# Patient Record
Sex: Female | Born: 1960 | Race: White | Hispanic: No | Marital: Married | Smoking: Never smoker
Health system: Southern US, Community
[De-identification: ages and names within clinical notes are randomized; demographics above are authoritative.]

---

## 2014-01-30 ENCOUNTER — Emergency Department: Payer: BC Managed Care – PPO

## 2014-01-30 ENCOUNTER — Emergency Department
Admission: EM | Admit: 2014-01-30 | Discharge: 2014-01-30 | Disposition: A | Discharge status: Home / Self Care | Payer: BC Managed Care – PPO | Attending: Emergency Medicine | Admitting: Emergency Medicine

## 2014-01-30 DIAGNOSIS — IMO0001 Reserved for inherently not codable concepts without codable children: Secondary | ICD-10-CM

## 2014-01-30 DIAGNOSIS — N390 Urinary tract infection, site not specified: Secondary | ICD-10-CM | POA: Insufficient documentation

## 2014-01-30 DIAGNOSIS — N200 Calculus of kidney: Secondary | ICD-10-CM | POA: Insufficient documentation

## 2014-01-30 DIAGNOSIS — R1031 Right lower quadrant pain: Secondary | ICD-10-CM | POA: Insufficient documentation

## 2014-01-30 DIAGNOSIS — R109 Unspecified abdominal pain: Secondary | ICD-10-CM

## 2014-01-30 DIAGNOSIS — I10 Essential (primary) hypertension: Secondary | ICD-10-CM | POA: Insufficient documentation

## 2014-01-30 LAB — URINALYSIS WITH CULTURE REFLEX
Bilirubin, UA: NEGATIVE
Blood, UA: NEGATIVE
Glucose, UA: NEGATIVE mg/dL
Ketones UA: NEGATIVE mg/dL
Leukocyte Esterase, UA: 75 Leu/uL — AB
Nitrite, UA: NEGATIVE
Protein, UR: 30 mg/dL — AB
RBC, UA: 13 /hpf — ABNORMAL HIGH (ref 0–5)
Urine Specific Gravity: 1.034 (ref 1.001–1.040)
Urobilinogen, UA: NORMAL mg/dL
WBC, UA: 12 /hpf — ABNORMAL HIGH (ref 0–4)
pH, Urine: 5 pH (ref 5.0–8.0)

## 2014-01-30 LAB — HEPATIC FUNCTION PANEL
ALT: 19 U/L (ref 0–55)
AST (SGOT): 24 U/L (ref 10–42)
Albumin/Globulin Ratio: 1.22 Ratio (ref 0.70–1.50)
Albumin: 3.9 gm/dL (ref 3.5–5.0)
Alkaline Phosphatase: 105 U/L (ref 40–145)
Bilirubin Direct: <0.1 mg/dL (ref 0.0–0.3)
Bilirubin, Total: 0.2 mg/dL (ref 0.1–1.2)
Globulin: 3.2 gm/dL (ref 2.0–4.0)
Protein, Total: 7.1 gm/dL (ref 6.0–8.3)

## 2014-01-30 LAB — CBC AND DIFFERENTIAL
Basophils %: 0.2 % (ref 0.0–3.0)
Basophils Absolute: 0 10*3/uL (ref 0.0–0.3)
Eosinophils %: 0.7 % (ref 0.0–7.0)
Eosinophils Absolute: 0.1 10*3/uL (ref 0.0–0.8)
Hematocrit: 40.6 % (ref 36.0–48.0)
Hemoglobin: 13.4 gm/dL (ref 12.0–16.0)
Lymphocytes Absolute: 2 10*3/uL (ref 0.6–5.1)
Lymphocytes: 17.4 % (ref 15.0–46.0)
MCH: 28 pg (ref 28–35)
MCHC: 33 gm/dL (ref 32–36)
MCV: 86 fL (ref 80–100)
MPV: 7.5 fL (ref 6.0–10.0)
Monocytes Absolute: 0.3 10*3/uL (ref 0.1–1.7)
Monocytes: 2.9 % — ABNORMAL LOW (ref 3.0–15.0)
Neutrophils %: 78.8 % — ABNORMAL HIGH (ref 42.0–78.0)
Neutrophils Absolute: 8.9 10*3/uL — ABNORMAL HIGH (ref 1.7–8.6)
PLT CT: 345 10*3/uL (ref 130–440)
RBC: 4.74 10*6/uL (ref 3.80–5.00)
RDW: 12.8 % (ref 11.0–14.0)
WBC: 11.3 10*3/uL — ABNORMAL HIGH (ref 4.0–11.0)

## 2014-01-30 LAB — I-STAT CHEM 8 CARTRIDGE
Anion Gap I-Stat: 17 — ABNORMAL HIGH (ref 7–16)
BUN I-Stat: 13 mg/dL (ref 6–20)
Calcium Ionized I-Stat: 4 mg/dL — ABNORMAL LOW (ref 4.35–5.10)
Chloride I-Stat: 109 mMol/L (ref 98–112)
Creatinine I-Stat: 0.8 mg/dL (ref 0.60–1.10)
EGFR: >60 mL/min/{1.73_m2}
Glucose I-Stat: 134 mg/dL — ABNORMAL HIGH (ref 70–99)
Hematocrit I-Stat: 38 % (ref 36.0–48.0)
Hemoglobin I-Stat: 12.9 gm/dL (ref 12.0–16.0)
Potassium I-Stat: 3.9 mMol/L (ref 3.5–5.3)
Sodium I-Stat: 140 mMol/L (ref 135–145)
TCO2 I-Stat: 19 mMol/L — ABNORMAL LOW (ref 24–29)

## 2014-01-30 LAB — LIPASE: Lipase: 16 U/L (ref 8–78)

## 2014-01-30 LAB — VH I-STAT CHEM 8 NOTIFICATION

## 2014-01-30 MED ORDER — ONDANSETRON HCL 4 MG/2ML IJ SOLN
INTRAMUSCULAR | Status: AC
Start: 2014-01-30 — End: ?
  Filled 2014-01-30: qty 2

## 2014-01-30 MED ORDER — HYDROCODONE-ACETAMINOPHEN 5-325 MG PO TABS
1.00 | ORAL_TABLET | Freq: Four times a day (QID) | ORAL | Status: DC | PRN
Start: 2014-01-30 — End: 2017-05-23

## 2014-01-30 MED ORDER — ONDANSETRON HCL 4 MG/2ML IJ SOLN
4.00 mg | Freq: Once | INTRAMUSCULAR | Status: AC
Start: 2014-01-30 — End: 2014-01-30
  Administered 2014-01-30: 4 mg via INTRAVENOUS

## 2014-01-30 MED ORDER — VH HYDROMORPHONE HCL PF 1 MG/ML CARPUJECT
0.50 mg | Freq: Once | INTRAMUSCULAR | Status: AC
Start: 2014-01-30 — End: 2014-01-30
  Administered 2014-01-30: 0.5 mg via INTRAVENOUS

## 2014-01-30 MED ORDER — CEPHALEXIN 500 MG PO CAPS
500.00 mg | ORAL_CAPSULE | Freq: Four times a day (QID) | ORAL | Status: AC
Start: 2014-01-30 — End: 2014-02-09

## 2014-01-30 MED ORDER — ONDANSETRON 4 MG PO TBDP
4.0000 mg | ORAL_TABLET | Freq: Three times a day (TID) | ORAL | Status: AC | PRN
Start: 2014-01-30 — End: ?

## 2014-01-30 MED ORDER — CEFTRIAXONE SODIUM 1 G IJ SOLR
1.00 g | Freq: Once | INTRAMUSCULAR | Status: AC
Start: 2014-01-30 — End: 2014-01-30
  Administered 2014-01-30: 1 g via INTRAVENOUS

## 2014-01-30 MED ORDER — CEFTRIAXONE SODIUM 1 G IJ SOLR
INTRAMUSCULAR | Status: AC
Start: 2014-01-30 — End: ?
  Filled 2014-01-30: qty 1000

## 2014-01-30 MED ORDER — IOHEXOL 350 MG/ML IV SOLN
100.00 mL | Freq: Once | INTRAVENOUS | Status: AC | PRN
Start: 2014-01-30 — End: 2014-01-30
  Administered 2014-01-30: 100 mL via INTRAVENOUS

## 2014-01-30 MED ORDER — SODIUM CHLORIDE 0.9 % IV SOLN
INTRAVENOUS | Status: DC
Start: 2014-01-30 — End: 2014-01-30

## 2014-01-30 MED ORDER — VH HYDROMORPHONE HCL 1 MG/ML (NARRATOR)
INTRAMUSCULAR | Status: AC
Start: 2014-01-30 — End: ?
  Filled 2014-01-30: qty 1

## 2014-01-30 NOTE — ED Notes (Signed)
Bed: N6-A  Expected date:   Expected time:   Means of arrival:   Comments:  [facility]

## 2014-01-30 NOTE — Discharge Instructions (Signed)
Understanding Urinary Tract Infections (UTIs)  Most UTIs are caused by bacteria, although they may also be caused by viruses or fungi. Bacteria from the bowel are the most common source of infection.The infection may begin because of any of the following:   Sexual activity. During sex, germs can travel from the penis, vagina, or rectum into the urethra.   Germs on the skinoutside the rectum may travel into the urethra. This is more common in women since the rectum and urethra are closer to each other than in men. Wiping from front to back after using the toilet and keeping the area clean can help prevent germs from getting to the urethra.   Blockage of urine flow through the urinary tract. If urine sits too long, germs may begin to grow out of control.    Parts of the urinary tract  The infection canoccur in any part of the urinary tract.   The kidneys collect and store urine.   The ureters carry urine from the kidneys to the bladder.   The bladder holds urine until you are ready to let it out.   The urethra carries urine from the bladder out of the body. It is shorter in women, so bacteria can move throughit more easily.The urethra is longer in men, so a UTI is less likely to reach the bladder or kidneys in men.   [address] [employer] [address] [place], [place] [address]. All rights reserved. This information is not intended as a substitute for professional medical care. Always follow your healthcare professional's instructions.          Understanding Kidney Stones  Your kidneys are bean-shaped organs. They help filter extra salts, waste, and water from your body. You need to drink enough water every day to help flush the extra salts into your urine.    What are kidney stones?  Kidney stones are made up of chemical crystals that separate out from urine. These crystals clump together to make"stones."They form in the calyx of the kidney. They may stay in the kidney or move into  the urinary tract.  Why kidney stones form  Kidneys form stones for many reasons. If you don't drink enough water, for instance, you won't have enough urine to dilute chemicals. Then the chemicals may form crystals, which can develop into stones:   Fluid loss (dehydration) can concentrate urine, causing stones to form.   Certain foods contain large amounts of the chemicals that sometimes crystallize into stones. Eating foods that contain a lot of meat or salt can lead to more kidney stones.   Kidney infections foster stones by slowing urine flow or changing the acid balance of your urine.   Family history. If family membershave had kidney stones, you're more likely to have them, too.   Deficiencies of certain substances in the urine that help protect you from forming stones can also increase the formation of stones.  Where stones form  Stones begin in the cup-shaped part of the kidney (calyx). Some stay in the calyx and grow. Others moveinto the kidney pelvis or into the ureter. There they can lodge, block the flow of urine, and cause pain.  Symptoms  Many stones cause sudden and severe pain andbloody urine. Others cause nausea or frequent, burning urination. Symptoms often depend on your stone's size and location. Fever may indicate a serious infection. Call your doctor right away if you develop a fever.   2000-2015 [employer] [place]. [address]  [address], [place], [place] [address]. All rights reserved. This information is not intended as a substitute for professional medical care. Always follow your healthcare professional's instructions.      Strain all urine.  Place collected stone in specimen container for evaluation by Urologist.  Drink > 2 Liters of non-alcoholic liquid every 24 hours.  Take Acetaminophen 650 mg every 4-6 hours, not to exceed 3000 mg in 24 hours for minor pain and do not mix Acetaminophen with prescribed pain medication in that they both contain Acetaminophen.  Your doctor may  recommend Ibuprofen 400 mg every 6 hours additionally, however do not take without their instruction.  If you take the prescribed narcotic containing pain medication, take Senokot 2 tabs daily to prevent constipation.  Titrate Senokot by one tablet nightly until on soft bowel movement daily is achieved.  Return to ED or follow up with Urology doctor's office for pain or vomiting uncontrolled with medication.  Return to Emergency Department  for fever.    Take your blood pressure three times daily and record in a diary. Take results to your primary care physician for medication adjustment.

## 2014-01-30 NOTE — ED Notes (Signed)
Felt fine yesterday. Patient woke up at 0500 this morning. Abdominal, mid lower pain. Associated with some nausea and vomiting, but states the pain is what really bothers her.   Denies diarrhea.   Constant sharp pain.

## 2014-01-30 NOTE — ED Provider Notes (Signed)
Physician/Midlevel provider first contact with patient: 01/30/14 1610         History     Chief Complaint   Patient presents with   . Abdominal Pain   Tonya Frey is a 53 y.o. female presenting with suprapubic and RLQ abdominal pain since 0500 this morning. Patient reports she woke this morning to severe pain with associated nausea, vomiting, dysuria and urinary frequency. She denies fevers, diarrhea, vaginal bleeding or discharge. Her last bowel movement was this morning around 0600 and normal per patient without blood in her stool or melena. She tried to take ibuprofen for her pain but was unable to keep it down.     History reviewed. No pertinent past medical history.    History reviewed. No pertinent past surgical history.    History reviewed. No pertinent family history.    Social  History   Substance Use Topics   . Smoking status: Never Smoker    . Smokeless tobacco: Not on file   . Alcohol Use: No       .     No Known Allergies    Discharge Medication List as of 01/30/2014 12:17 PM           Review of Systems   Constitutional: Negative for fever and activity change.   HENT: Negative for congestion, ear pain and sore throat.    Respiratory: Negative for cough, chest tightness and shortness of breath.    Cardiovascular: Negative for chest pain and leg swelling.   Gastrointestinal: Positive for nausea, vomiting and abdominal pain. Negative for diarrhea and blood in stool.   Genitourinary: Positive for dysuria and frequency. Negative for flank pain, vaginal bleeding, vaginal discharge, difficulty urinating, vaginal pain and pelvic pain.   Musculoskeletal:        No Injuries   Skin: Negative for color change, pallor and rash.   Neurological: Negative for weakness and numbness.   Psychiatric/Behavioral: Negative for behavioral problems.   All other systems reviewed and are negative.      Physical Exam    BP: 174/88 mmHg, Heart Rate: (!) 58, Temp: 97.4 F (36.3 C), Resp Rate: 22, SpO2: 97 %, Weight:  90.719 kg     Physical Exam   Constitutional: She is oriented to person, place, and time. She appears well-developed and well-nourished.   HENT:   Head: Normocephalic and atraumatic.   Mouth/Throat: Oropharynx is clear and moist.   Tympanic Membranes Normal   Eyes: Conjunctivae and EOM are normal. Pupils are equal, round, and reactive to light.   Neck: Normal range of motion. Neck supple.   Cardiovascular: Normal rate, regular rhythm, normal heart sounds and intact distal pulses.    Pulmonary/Chest: Effort normal. No respiratory distress.   Diminished breath sounds bilaterally.    Abdominal: Soft. Bowel sounds are normal. There is no tenderness.   Mild right CVA TTP. Negative murphy's sign.    Musculoskeletal: Normal range of motion. She exhibits no edema.   Neurological: She is alert and oriented to person, place, and time. No cranial nerve deficit. She exhibits normal muscle tone.   Skin: Skin is warm and dry. No rash noted.   Psychiatric: She has a normal mood and affect. Her behavior is normal. Judgment and thought content normal.   Nursing note and vitals reviewed.        MDM and ED Course     ED Medication Orders     Start     Status Ordering Provider  01/30/14 1153  cefTRIAXone (ROCEPHIN) 1 g in sodium chloride 0.9 % 100 mL IVPB mini-bag plus   Once in ED     Route: Intravenous  Ordered Dose: 1 g     Last MAR action:  Stopped Cledith Kamiya K    01/30/14 1039  HYDROmorphone (DILAUDID) injection 0.5 mg   Once in ED     Route: Intravenous  Ordered Dose: 0.5 mg     Last MAR action:  Given Dmauri Rosenow K    01/30/14 1039  ondansetron (ZOFRAN) injection 4 mg   Once in ED     Route: Intravenous  Ordered Dose: 4 mg     Last MAR action:  Given Crisanto Nied K    01/30/14 1039     Continuous,   Status:  Discontinued     Route: Intravenous     Discontinued Mylei Brackeen, Chrissie Noa K              MDM     DDX: UTI, pyelonephritis, nephrolithiasis, atypical presentation of appendicitis versus diverticulitis. Low suspicion  for vascular disease.    Results: CBC white count 11.3, left shift 79% neutrophils. UA: Consistent with infection versus nephrolithiasis. LFTs normal. Lipase normal. I-STAT Chem-8, bicarbonate low at 19, calcium low at 4, glucose elevated 134, gap elevated 17. Normal renal function, normal electrolytes.    CT abdomen and pelvis:  Impression:     1. 4 mm obstructing right lower ureteral stone just above the UVJ.  2. Hepatic steatosis and hepatomegaly.              ED course:  Antibiotics initiated in the emergency department.  Pain control with Dilaudid and Zofran.    MDM stone, size 4 mm, will likely pass on its own. There is evidence of possible infection. Patient is given Rocephin in the emergency department. Also, this will be treated with Keflex as an outpatient. Urine sent for culture. Patient also be treated with pain medication. Strainers and specimen container with patient. Urology FU    Discharge instructions reviewed at bedside. Patient to take and record blood pressure at home fo rmanagement with PMD.    Vital signs mild hypertension otherwise stable.  Procedures    Clinical Impression & Disposition     Clinical Impression  Final diagnoses:   Nephrolithiasis   Right lower quadrant abdominal pain   Acute right flank pain   Acute UTI (urinary tract infection)   Elevated blood pressure        ED Disposition     Discharge Deon Pilling discharge to home/self care.    Condition at disposition: Stable             Discharge Medication List as of 01/30/2014 12:17 PM      START taking these medications    Details   HYDROcodone-acetaminophen (NORCO) 5-325 MG per tablet Take 1-2 tablets by mouth every 6 (six) hours as needed for Pain., Starting 01/30/2014, Until Discontinued, Print      ondansetron (ZOFRAN ODT) 4 MG disintegrating tablet Take 1 tablet (4 mg total) by mouth every 8 (eight) hours as needed., Starting 01/30/2014, Until Discontinued, Print                   The documentation recorded by my  scribe, Salli Quarry, accurately reflects the services I personally performed and the decisions made by me.  Bernette Redbird, MD      Cloyde Reams Lum Keas, MD  02/01/14 [phone]

## 2020-03-03 IMAGING — MR COL LOMBAR^ROTINAS
12 of 15 series · 40 of 48 positions shown · non-contrast
Comparison: none

[Series 3: sagital_t2 cervical · sagittal · 3.0mm · 0.62mm/px · 3 of 12 slices shown (1 of 2)]
[im 1/12]
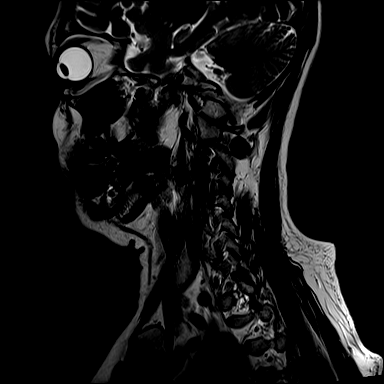
[im 6/12]
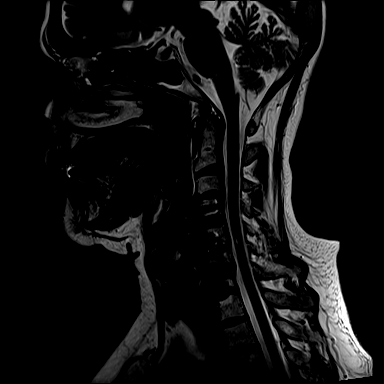
[im 12/12]
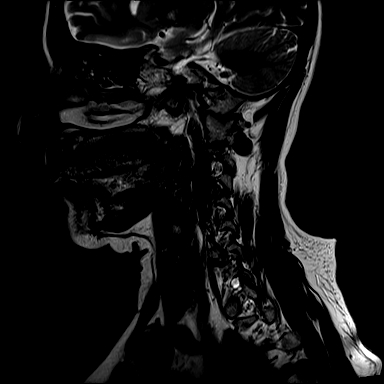

[Series 4: sagital_t2 cervical · sagittal · 3.0mm · 0.62mm/px · 2 of 12 slices shown (2 of 2)]
[im 1/12]
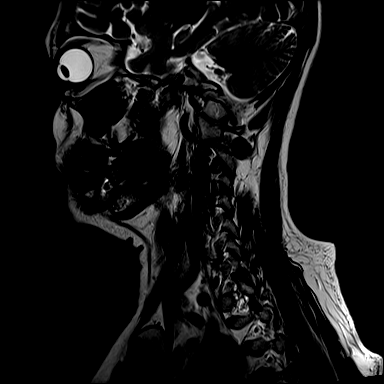
[im 12/12]
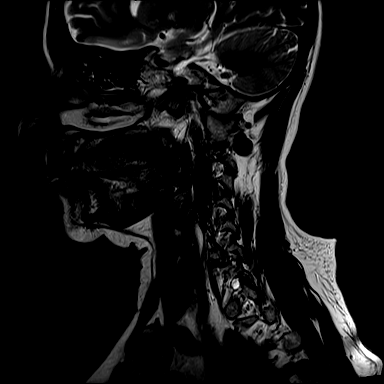

[Series 5: sagital_t2_dorsal · sagittal · 3.0mm · 0.71mm/px · 2 of 12 slices shown]
[im 1/12]
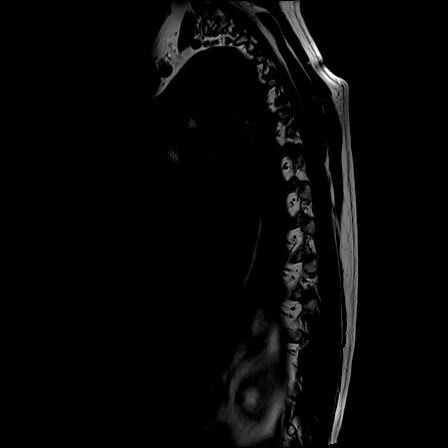
[im 12/12]
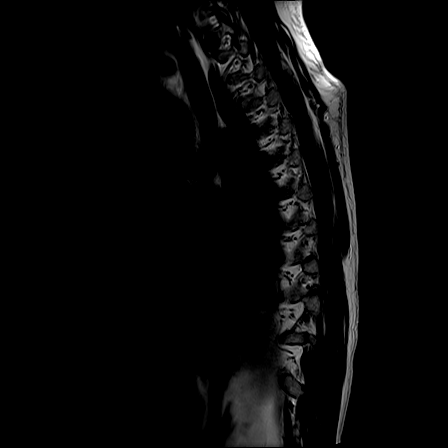

[Series 6: sagital_stir_dorsal · sagittal · 3.0mm · 1.00mm/px · 2 of 12 slices shown]
[im 1/12]
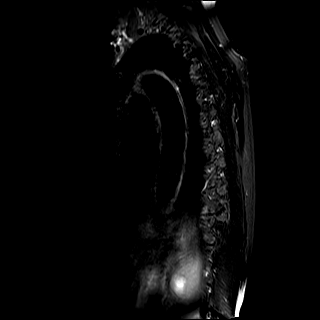
[im 12/12]
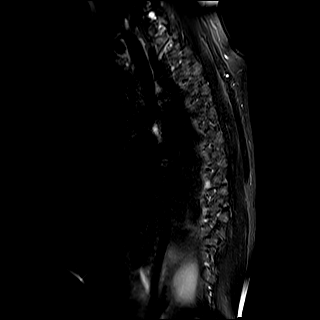

[Series 7: sagital_t1_dorsal · sagittal · 3.0mm · 0.83mm/px · 2 of 12 slices shown]
[im 1/12]
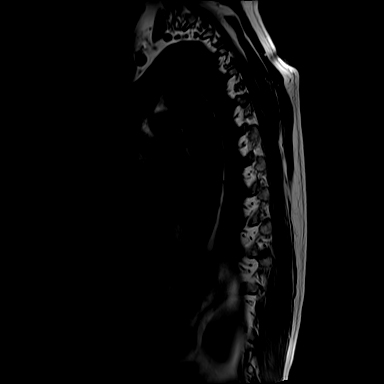
[im 12/12]
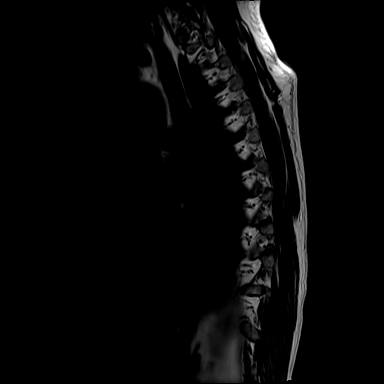

[Series 8: axial_t2_tse_bloco 1 · axial · 3.5mm · 0.56mm/px · z∈[-173,-40]mm · 6 of 30 slices shown]
[im 1/30]
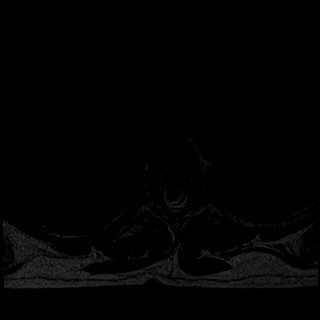
[im 6/30]
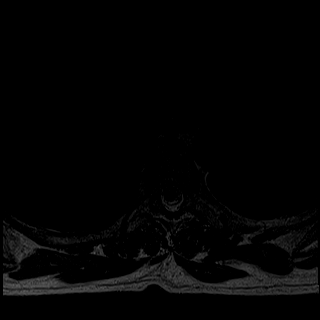
[im 12/30]
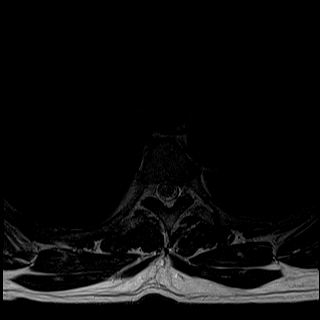
[im 18/30]
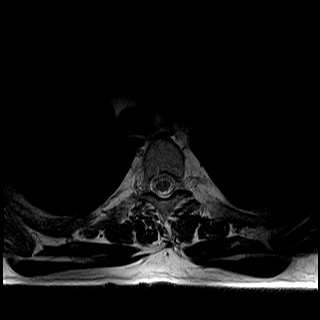
[im 24/30]
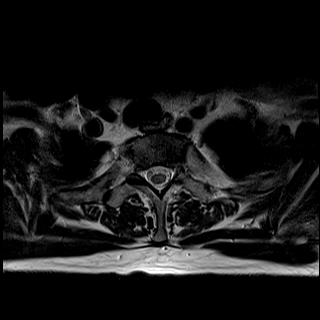
[im 30/30]
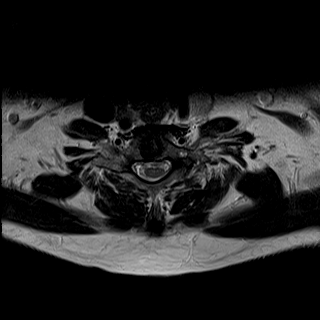

[Series 9: axial_t2_tse_bloco 2 · axial · 3.5mm · 0.56mm/px · z∈[-306,-158]mm · 6 of 30 slices shown]
[im 1/30]
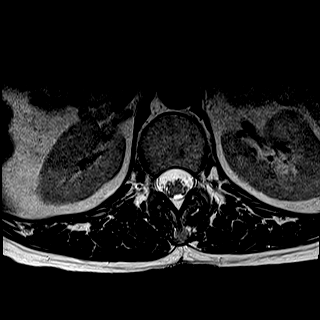
[im 6/30]
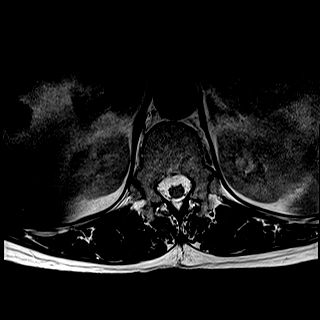
[im 12/30]
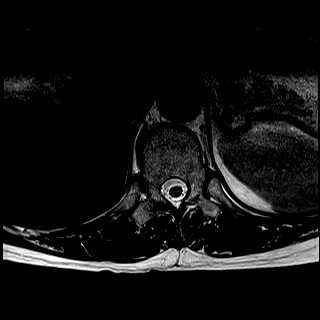
[im 18/30]
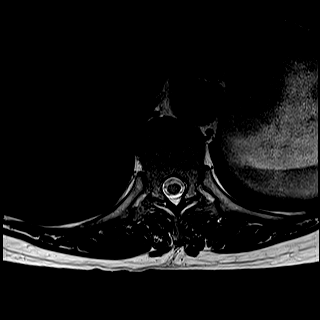
[im 24/30]
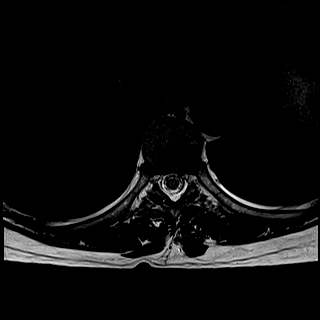
[im 30/30]
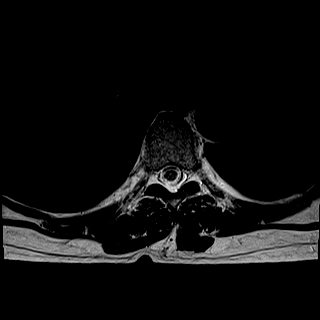

[Series 11: sagital_t2_lombar · sagittal · 4.0mm · 0.65mm/px · 2 of 12 slices shown]
[im 1/12]
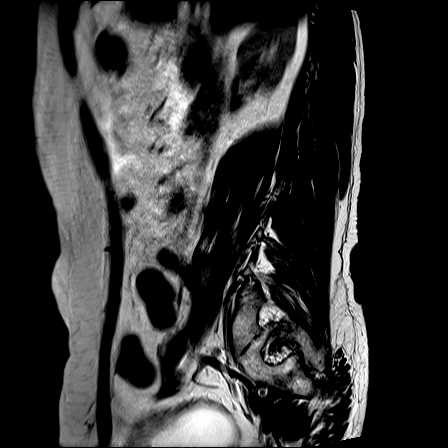
[im 12/12]
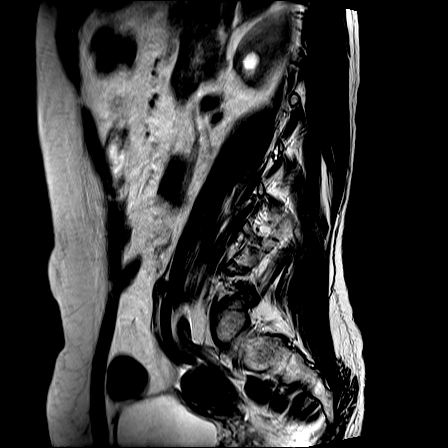

[Series 12: sagital_t1_lombar · sagittal · 4.0mm · 0.76mm/px · 2 of 12 slices shown]
[im 1/12]
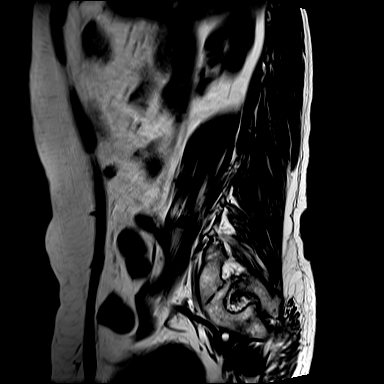
[im 12/12]
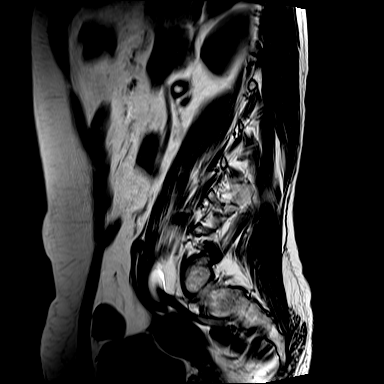

[Series 13: axial_t2_bloco_lombar · axial · 4.0mm · 0.62mm/px · z∈[-481,-351]mm · 6 of 29 slices shown]
[im 1/29]
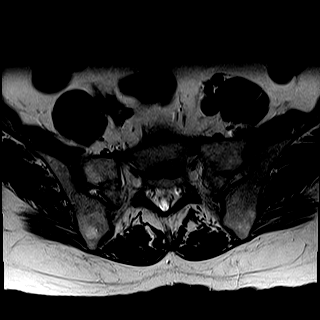
[im 6/29]
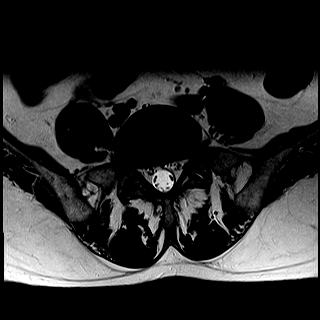
[im 12/29]
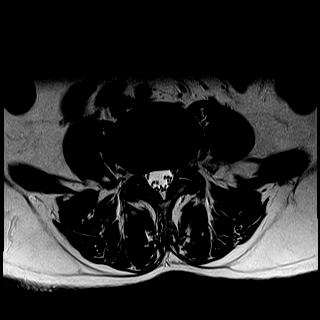
[im 17/29]
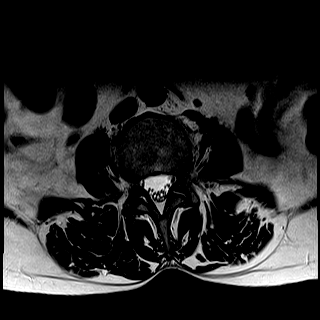
[im 23/29]
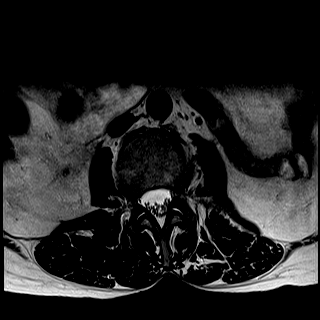
[im 29/29]
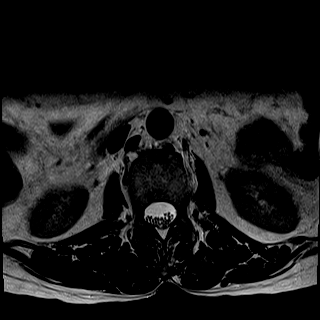

[Series 14: sagital_stir_lombar · sagittal · 4.0mm · 0.91mm/px · 1 of 12 slices shown]
[im 1/12]
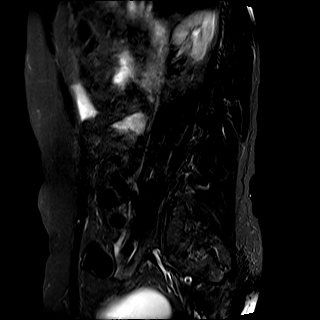

[Series 5002: T2 · axial · 4.0mm · 0.62mm/px · z∈[-481,-351]mm · 6 of 29 slices shown]
[im 1/29]
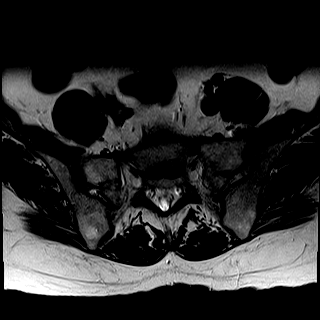
[im 6/29]
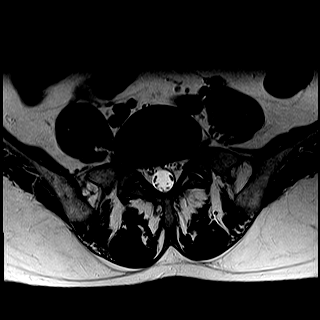
[im 12/29]
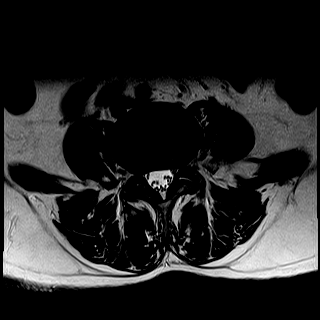
[im 17/29]
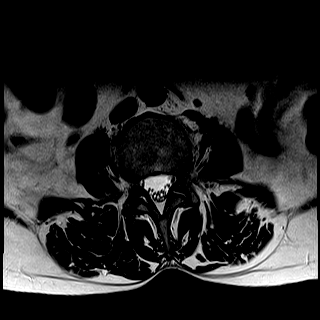
[im 23/29]
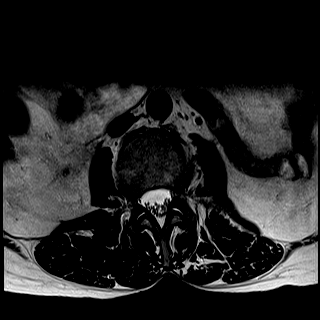
[im 29/29]
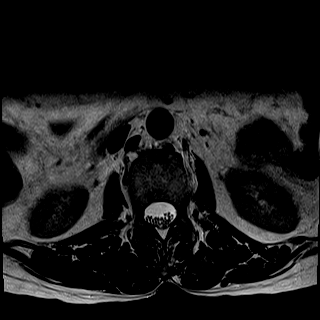

[40 of 48 positions shown; findings below may reference images not displayed]

METODOLOGIA:
Exame  realizado  com  sequências  ponderadas  em  T1  e  T2,  sem  a  administração  intravenosa  do  agente  de  contraste 
paramagnético.
ANÁLISE:
O canal vertebral exibe de dimensões normais por toda extensão avaliada.
Os corpos vertebrais apresentam altura e alinhamento posterior preservados.
RESSONÂNCIA MAGNÉTICA DA COLUNA TORÁCICA
Desidratação discal difusa.
Nódulo de Schmorl com aspecto crônico no platô vertebral inferior de T9.
Protrusão  discal  posterior  e  paramediana  direita  em  T11-T12  que  determina  leve  compressão  sobre  a  face  ventral  do 
saco dural.
Articulações interfacetarias sem alterações significativas.
Forames de conjugação livres.
A medula espinhal torácica foi visibilizada em toda sua extensão apresentando calibre e intensidade de sinal normal.
IMPRESSÃO:
Avaliação por ressonância magnética da coluna torácica evidenciando:
Desidratação discal difusa.
Protrusão  discal  posterior  e  paramediana  direita  em  T11-T12  que  determina  leve  compressão  sobre  a  face  ventral  do 
saco dural.

## 2021-11-09 IMAGING — MR COL LOMBAR^ROTINAS
5 of 6 series · 35 of 48 positions shown · non-contrast
Comparison: none

[Series 2: sagital_t2 · sagittal · 4.0mm · 0.65mm/px · 7 of 12 slices shown]
[im 1/12]
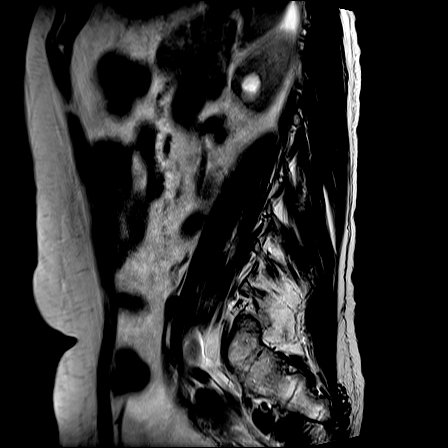
[im 2/12]
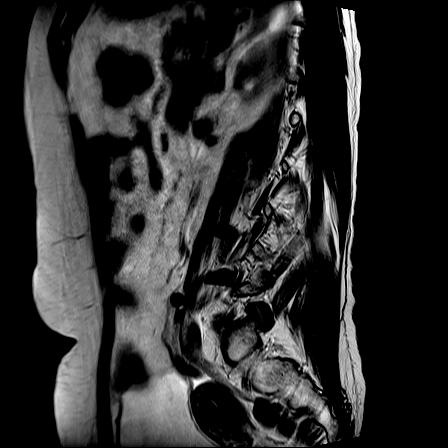
[im 4/12]
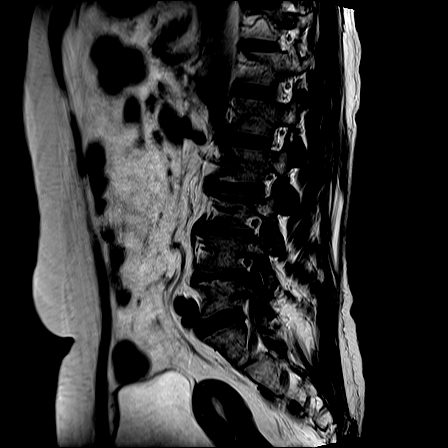
[im 6/12]
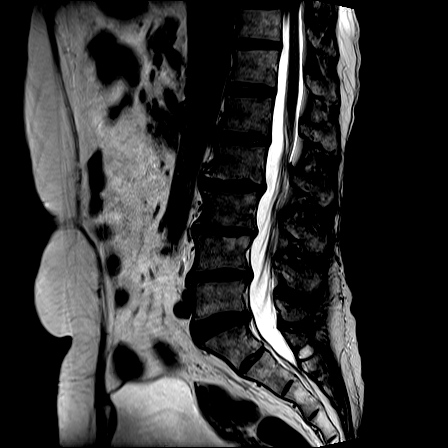
[im 8/12]
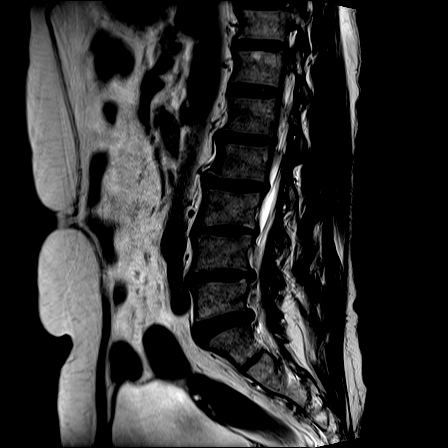
[im 10/12]
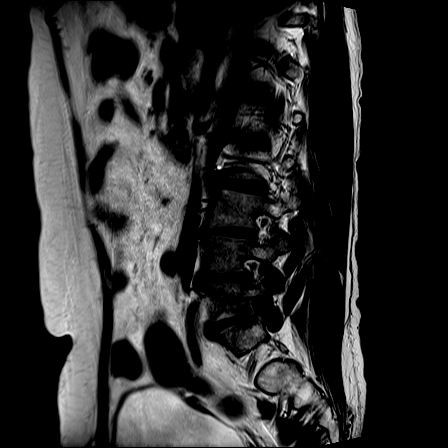
[im 12/12]
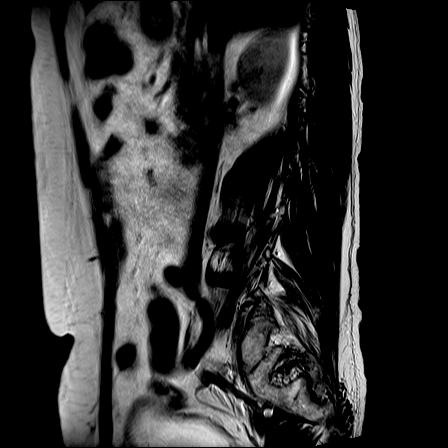

[Series 3: sagital_t1 · sagittal · 4.0mm · 0.76mm/px · 6 of 12 slices shown]
[im 1/12]
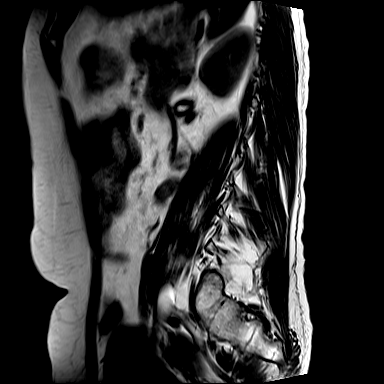
[im 3/12]
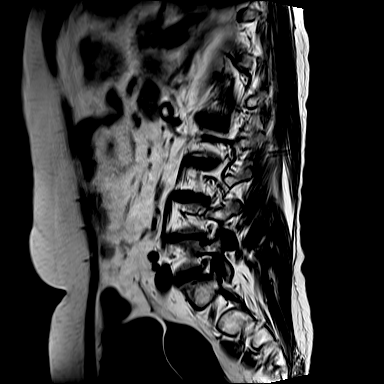
[im 5/12]
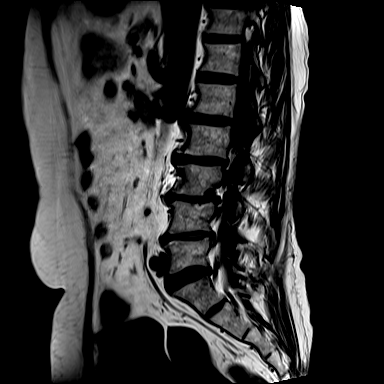
[im 7/12]
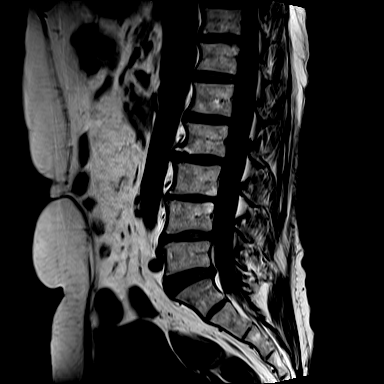
[im 9/12]
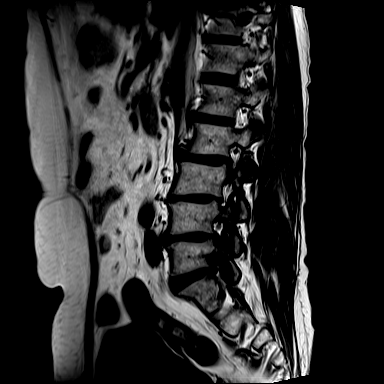
[im 12/12]
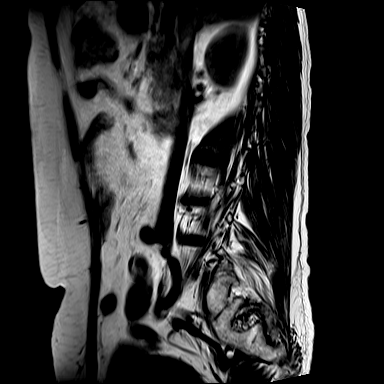

[Series 4: sagital_stir · sagittal · 4.0mm · 0.91mm/px · 6 of 12 slices shown]
[im 1/12]
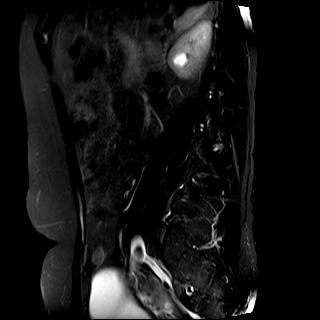
[im 3/12]
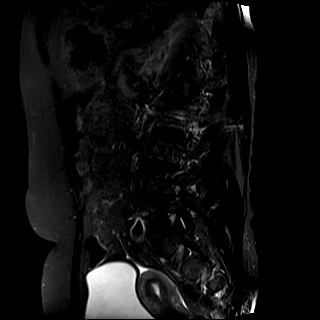
[im 5/12]
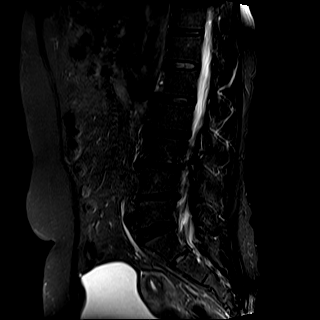
[im 7/12]
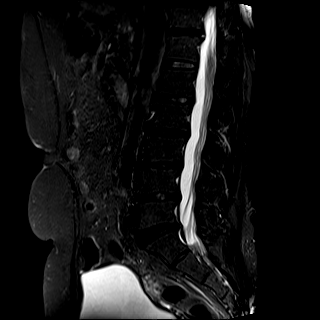
[im 9/12]
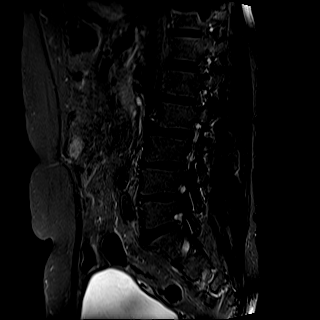
[im 12/12]
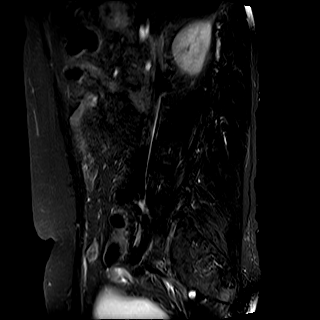

[Series 5: axial_t2 · axial · 4.0mm · 0.62mm/px · z∈[-70,+72]mm · 11 of 33 slices shown]
[im 3/33]
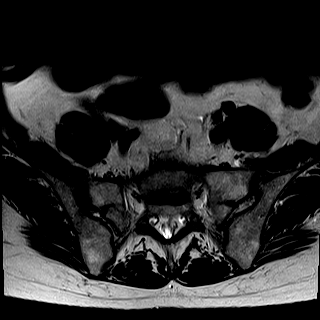
[im 5/33]
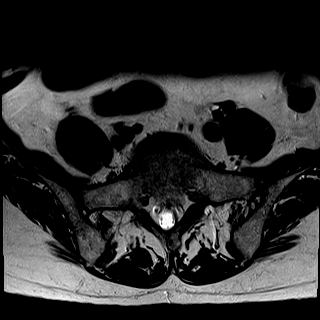
[im 7/33]
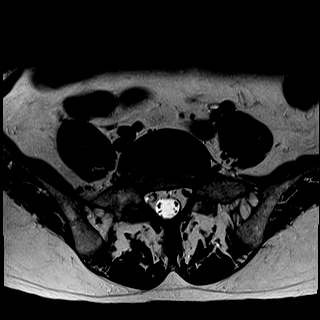
[im 11/33]
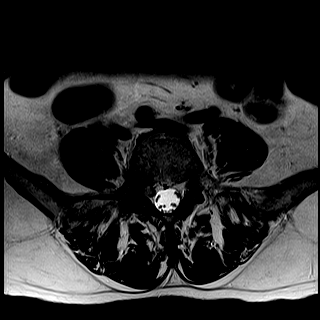
[im 15/33]
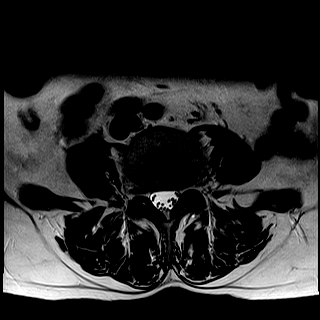
[im 17/33]
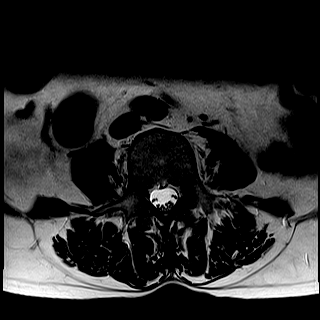
[im 19/33]
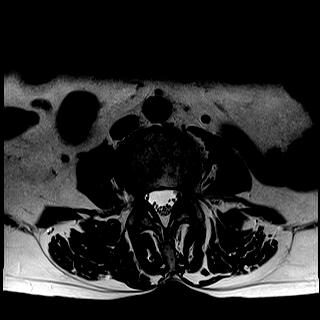
[im 23/33]
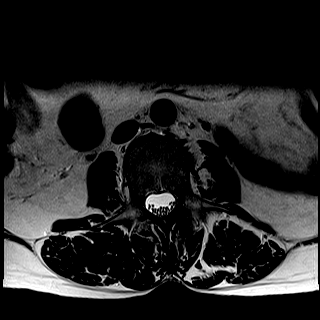
[im 27/33]
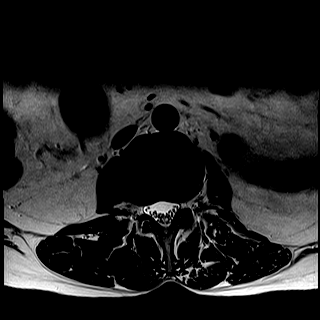
[im 29/33]
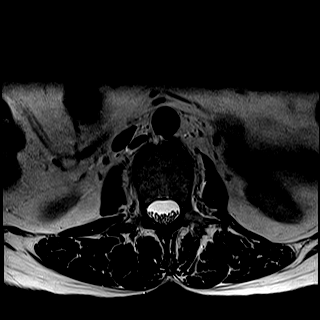
[im 31/33]
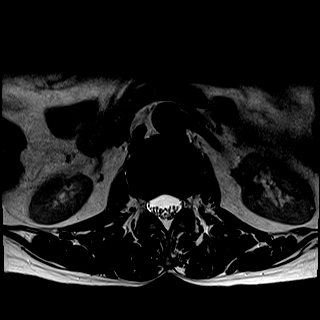

[Series 6: coronal_t2 · coronal · 4.0mm · 0.71mm/px · 5 of 12 slices shown]
[im 1/12]
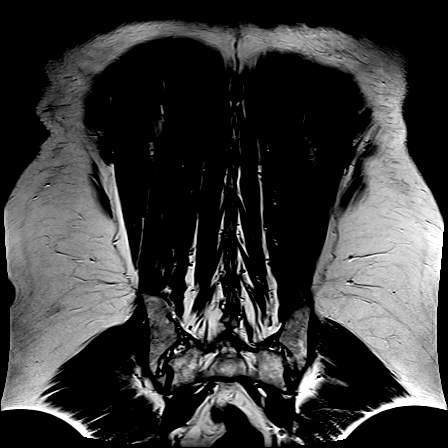
[im 3/12]
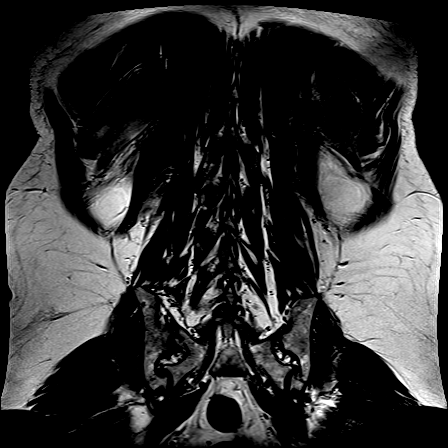
[im 5/12]
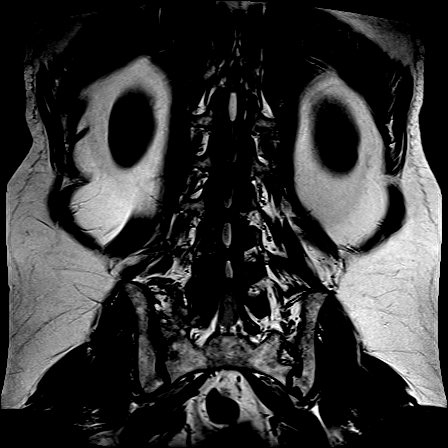
[im 7/12]
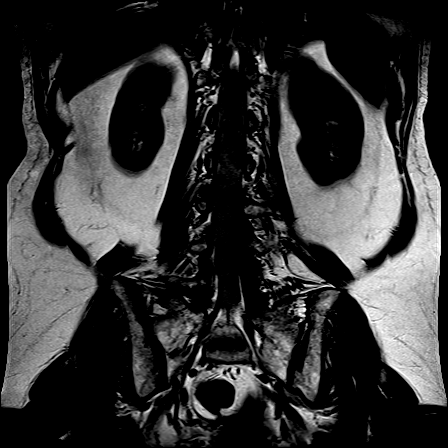
[im 9/12]
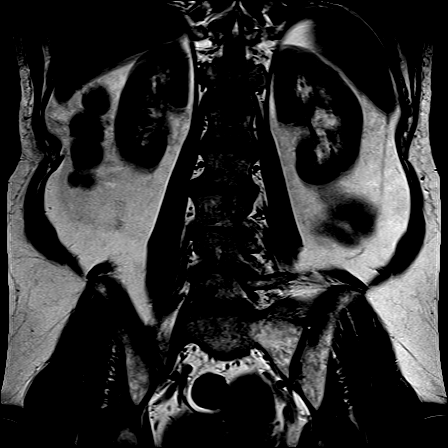

[35 of 48 positions shown; findings below may reference images not displayed]

TECNICA DE EXAME:
As imagens de ressonância magnética foram obtidas com técnica FSE, ponderações T1 e T2 sem contraste.
OS SEGUINTES ASPECTOS FORAM OBSERVADOS: 
Os corpos vertebrais apresentam altura e alinhamento preservado. 
Escoliose de convexidade para direita
RESSONANCIA MAGNETICA DA COLUNA LOMBAR
Sinais de espondilose caracterizada por reações osteofitárias marginais
Desidratação discal difusa
Em L2-L3, L3-L4, L4-L5, L5-S1: Abaulamento discal que comprime a face ventral do saco dural com
insinuação para as bases foraminais em intima relação com as raízes emergentes.
O canal vertebral apresenta dimensões preservadas. 
O cone medular ‘e tópico, apresentando calibre e intensidade de sinal preservados. 
Raízes nervosas da cauda eqüina com morfologia e distribuição anatômica.
Articulações interfacetarias e musculatura paravertebral sem anormalidades significativas. 
IMPRESSAO DIAGNOSTICA: 
Avaliação por ressonância magnética da coluna lombar revelando:
Escoliose de convexidade para direita
Sinais de espondilose caracterizada por reações osteofitárias marginais
Desidratação discal difusa
Em L2-L3, L3-L4, L4-L5, L5-S1: Abaulamento discal que comprime a face ventral do saco dural com
insinuação para as bases foraminais em intima relação com as raízes emergentes.

## 2021-11-09 IMAGING — MR QUADRIL^DIREITO
6 of 7 series · 44 of 48 positions shown · non-contrast
Comparison: none

[Series 2: coronal_dp_fs · coronal · right · 3.5mm · 0.62mm/px · 7 of 20 slices shown]
[im 1/20]
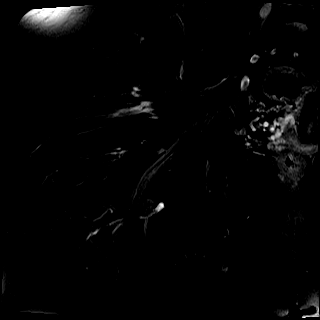
[im 4/20]
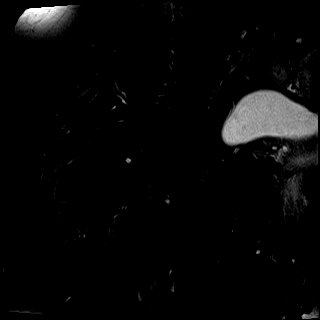
[im 7/20]
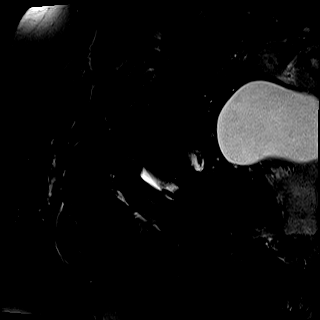
[im 10/20]
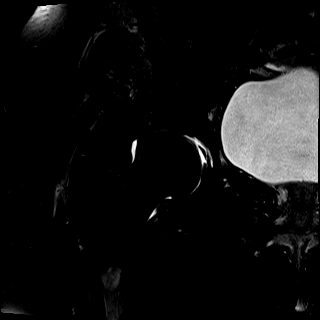
[im 13/20]
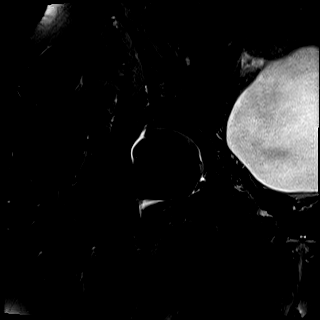
[im 16/20]
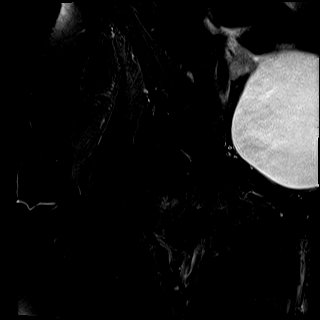
[im 20/20]
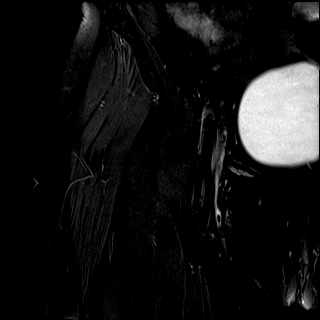

[Series 3: coronal_t1 · coronal · right · 3.5mm · 0.62mm/px · 7 of 20 slices shown]
[im 1/20]
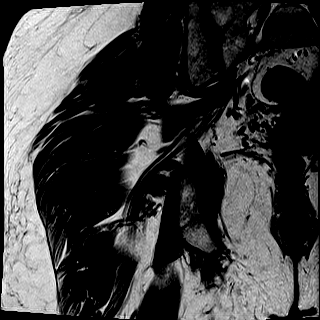
[im 4/20]
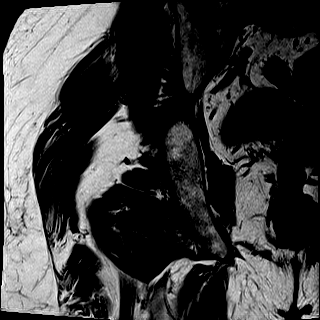
[im 7/20]
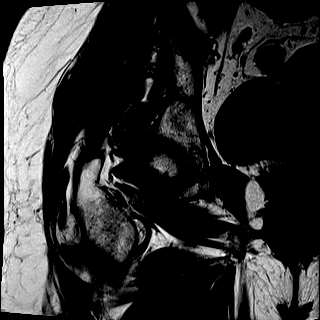
[im 10/20]
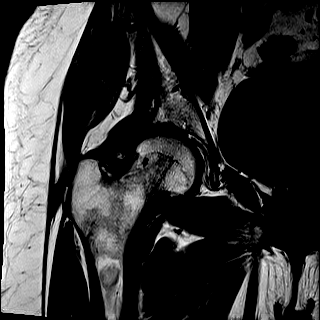
[im 13/20]
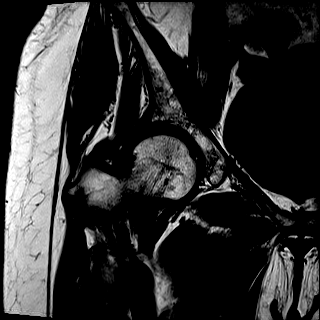
[im 16/20]
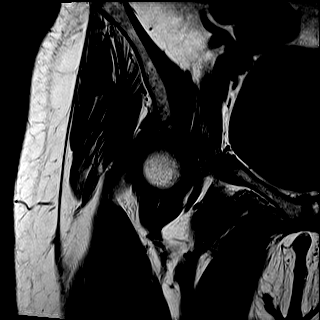
[im 20/20]
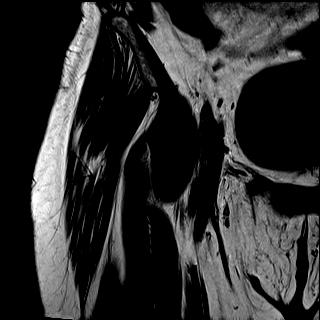

[Series 4: axial_dp_fs · axial · right · 3.5mm · 0.62mm/px · z∈[-12,+84]mm · 8 of 24 slices shown]
[im 1/24]
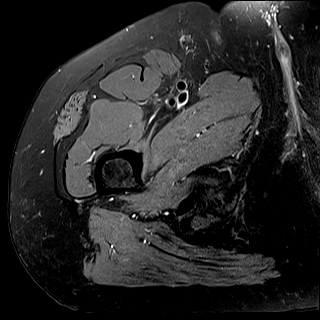
[im 4/24]
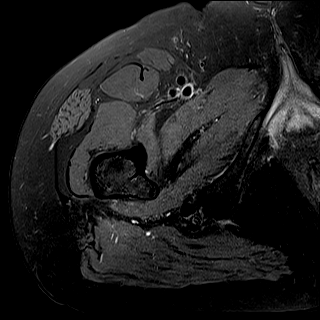
[im 7/24]
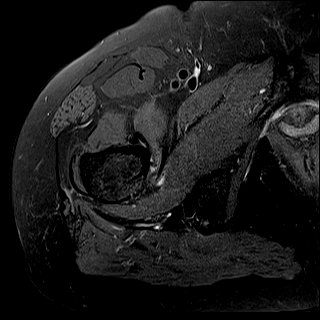
[im 10/24]
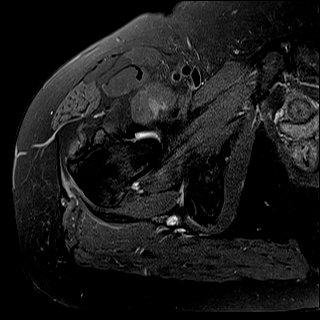
[im 14/24]
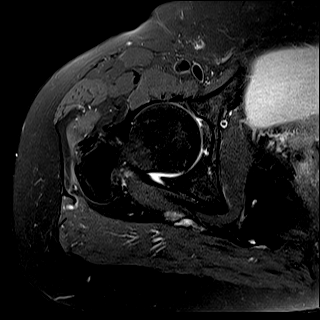
[im 17/24]
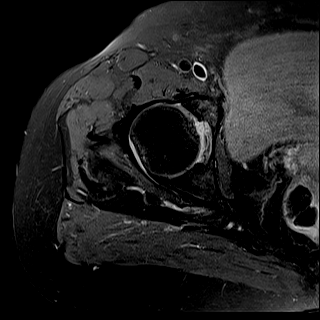
[im 20/24]
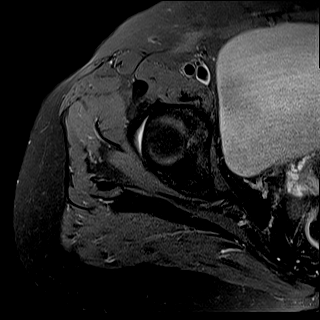
[im 24/24]
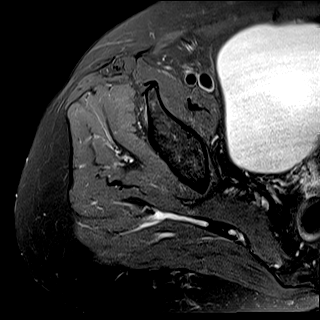

[Series 5: sagital_dp_fs · sagittal · right · 3.5mm · 0.62mm/px · 7 of 20 slices shown]
[im 1/20]
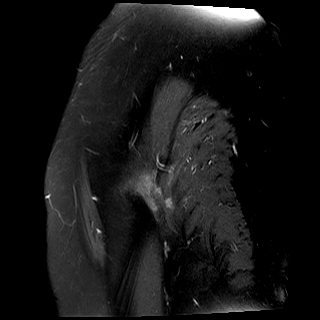
[im 4/20]
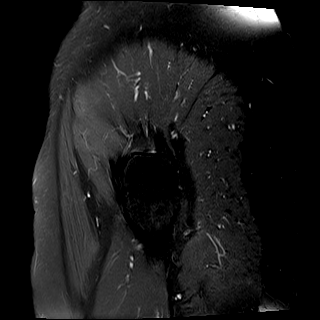
[im 7/20]
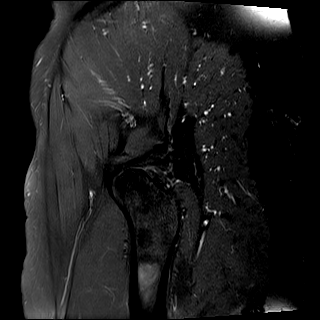
[im 10/20]
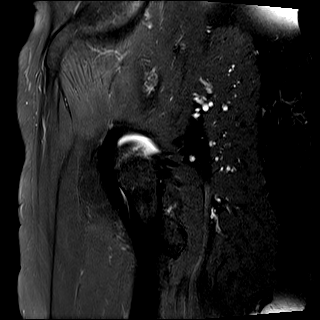
[im 13/20]
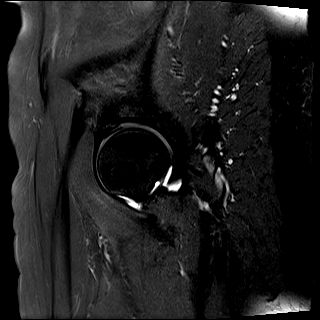
[im 16/20]
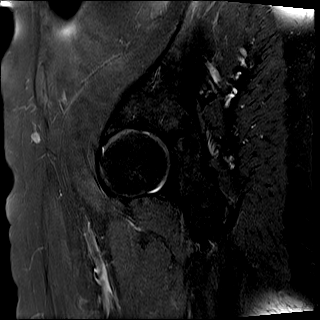
[im 20/20]
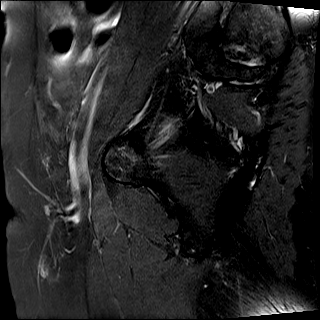

[Series 6: axial_t1 · axial · right · 3.5mm · 0.62mm/px · z∈[-12,+84]mm · 8 of 24 slices shown]
[im 1/24]
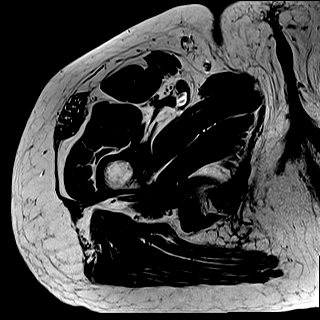
[im 4/24]
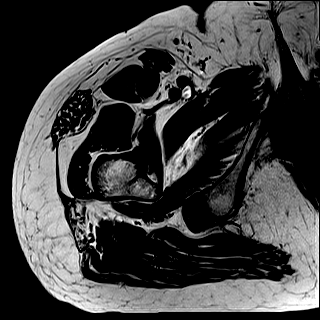
[im 7/24]
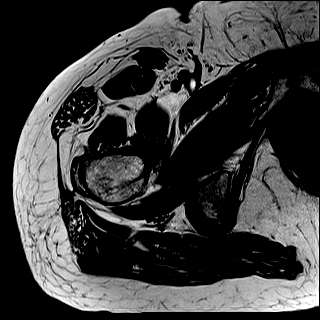
[im 10/24]
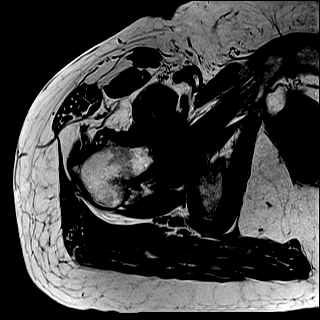
[im 14/24]
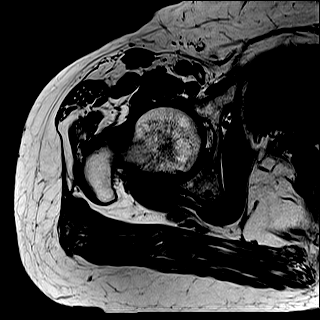
[im 17/24]
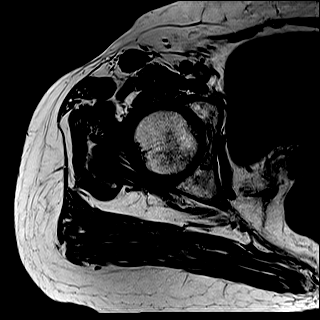
[im 20/24]
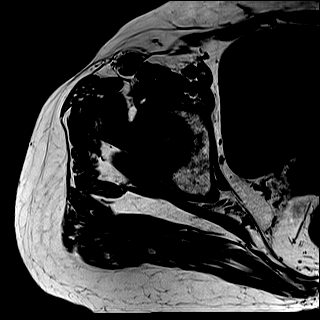
[im 24/24]
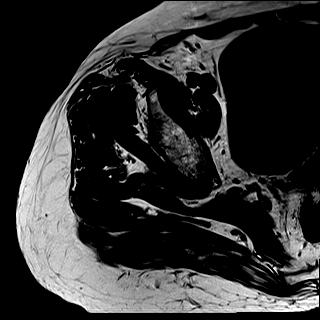

[Series 7: axial_dp_fs_obliquo · axial · right · 3.5mm · 0.62mm/px · z∈[-29,+36]mm · 7 of 20 slices shown]
[im 1/20]
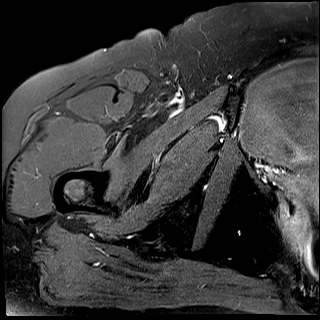
[im 4/20]
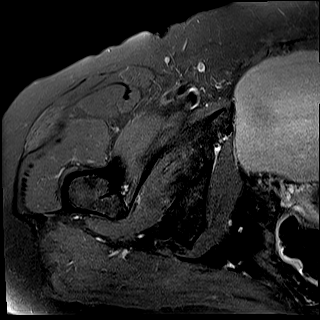
[im 7/20]
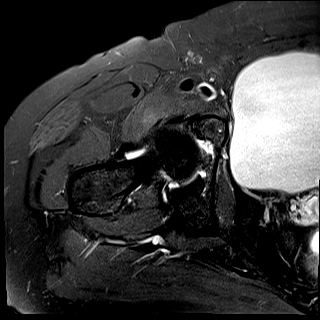
[im 10/20]
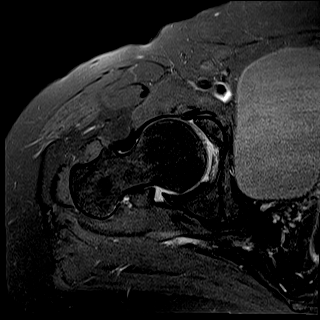
[im 13/20]
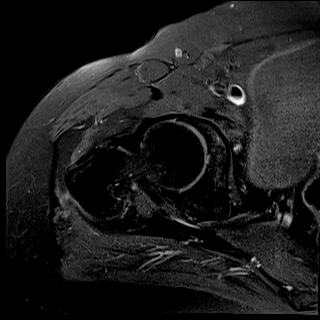
[im 16/20]
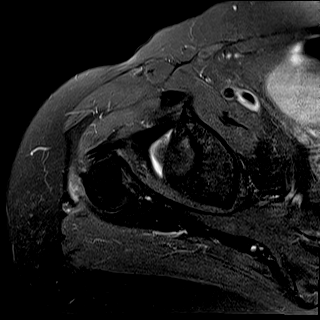
[im 20/20]
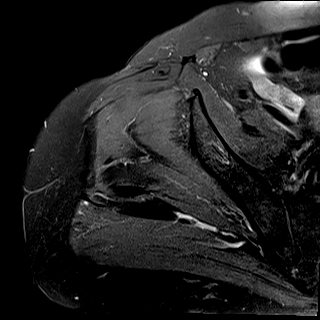

[44 of 48 positions shown; findings below may reference images not displayed]

Técnica:
Exame realizado com sequências ponderadas em T1 e T2/DP, sem e com supressão de gordura.
Relatório:
Tendinopatia e leve peritendinite justainsercional dos glúteos, médio e mínimo, sem roturas.
RESSONÂNCIA MAGNÉTICA DO QUADRIL DIREITO
Edema do coxim adiposo interposto entre a face lateral do trocânter maior femoral e o trato iliotibial, achado
que pode ser assintomático ou estar relacionado a atrito / bursite.
Artropatia do quadril, caracterizada por pequenos osteófitos marginais no teto acetabular e face oposta da
cabeça femoral, além de afilamento condral irregular na área de carga, notando-se mínimo edema ósseo
subcondral no contorno anterossuperior acetabular. Pequeno derrame articular interposto.
Alteração degenerativa com irregularidades marginais na porção anterossuperior e súperolateral do lábio
acetabular. Demais segmentos labrais conservados.
Demais estruturas ósseas de morfologia e sinal conservados.
Demais áreas de cartilagem hialina de aspecto preservado ao método.
Ausência de alterações morfológicas tipo pincer ou CAM.
(ângulo alfa de 50º e ângulo centro-borda de [place] de 31º).
Ausência de sinais sugestivos de osteonecrose da cabeça femoral.
Ligamento redondo íntegro.
Demais tendões de espessura e sinal preservados.
Planos musculares com trofismo habitual.
